# Patient Record
Sex: Female | Born: 1965 | Hispanic: Yes | Marital: Single | Smoking: Never smoker
Health system: Southern US, Community
[De-identification: ages and names within clinical notes are randomized; demographics above are authoritative.]

## PROBLEM LIST (undated history)

## (undated) DIAGNOSIS — E7800 Pure hypercholesterolemia, unspecified: Secondary | ICD-10-CM

## (undated) DIAGNOSIS — I10 Essential (primary) hypertension: Secondary | ICD-10-CM

## (undated) DIAGNOSIS — K219 Gastro-esophageal reflux disease without esophagitis: Secondary | ICD-10-CM

---

## 2019-10-06 ENCOUNTER — Other Ambulatory Visit: Payer: Self-pay

## 2019-10-06 ENCOUNTER — Encounter (HOSPITAL_COMMUNITY): Payer: Self-pay | Admitting: Emergency Medicine

## 2019-10-06 ENCOUNTER — Emergency Department (HOSPITAL_COMMUNITY)
Admission: EM | Admit: 2019-10-06 | Discharge: 2019-10-06 | Disposition: A | Discharge status: Home / Self Care | Payer: Medicaid Other | Attending: Emergency Medicine | Admitting: Emergency Medicine

## 2019-10-06 DIAGNOSIS — R519 Headache, unspecified: Secondary | ICD-10-CM | POA: Diagnosis present

## 2019-10-06 DIAGNOSIS — Z794 Long term (current) use of insulin: Secondary | ICD-10-CM | POA: Insufficient documentation

## 2019-10-06 DIAGNOSIS — I10 Essential (primary) hypertension: Secondary | ICD-10-CM | POA: Insufficient documentation

## 2019-10-06 DIAGNOSIS — E119 Type 2 diabetes mellitus without complications: Secondary | ICD-10-CM | POA: Insufficient documentation

## 2019-10-06 DIAGNOSIS — M5481 Occipital neuralgia: Secondary | ICD-10-CM | POA: Insufficient documentation

## 2019-10-06 DIAGNOSIS — J45909 Unspecified asthma, uncomplicated: Secondary | ICD-10-CM | POA: Diagnosis not present

## 2019-10-06 DIAGNOSIS — L242 Irritant contact dermatitis due to solvents: Secondary | ICD-10-CM | POA: Diagnosis not present

## 2019-10-06 HISTORY — DX: Pure hypercholesterolemia, unspecified: E78.00

## 2019-10-06 HISTORY — DX: Gastro-esophageal reflux disease without esophagitis: K21.9

## 2019-10-06 HISTORY — DX: Essential (primary) hypertension: I10

## 2019-10-06 MED ORDER — HYDROXYZINE HCL 25 MG PO TABS
25.0000 mg | ORAL_TABLET | Freq: Four times a day (QID) | ORAL | 0 refills | Status: AC
Start: 2019-10-06 — End: ?

## 2019-10-06 MED ORDER — PREDNISONE 10 MG PO TABS
20.0000 mg | ORAL_TABLET | Freq: Every day | ORAL | 0 refills | Status: AC
Start: 2019-10-06 — End: 2019-10-11

## 2019-10-06 NOTE — ED Triage Notes (Signed)
Pt reports "bump" to back of head and R side of face painful and itching.

## 2019-10-06 NOTE — Discharge Instructions (Addendum)
You were seen in the ER for scalp irritation  I think you have had a reaction to the hair dye in bleach she used  We will treat you with medicines to help with inflammation and itching  Use prednisone as prescribed, we discussed this medicine can make glucose levels elevated.  Monitor your diet and insulin regimen.  Stop this medicine if your glucose levels are very high.  Use hydroxyzine for itching as needed.  Cleanse your scalp using only water until your symptoms improve  Return to the ER for worsening symptoms, worsening pain, redness, warmth, discharge, fever

## 2019-10-06 NOTE — ED Provider Notes (Signed)
[facility] EMERGENCY DEPARTMENT Provider Note   CSN: [id number] Arrival date & time: 10/06/19  2025     History Chief Complaint  Patient presents with  . bump to back of head  . Facial Pain    Samantha Bauer is a 54 y.o. female with past medical history of diabetes on oral agents and insulin, GERD, hypertension, hyperlipidemia, asthma presents to the ER for sudden onset of diffuse itchiness, swelling and pain on her scalp 2 days ago.  States 3 days ago she dyed her hair and bleached it.  States she is never done this before.  Has no fevers.  No interventions.  No associated symptoms.  HPI     Past Medical History:  Diagnosis Date  . GERD (gastroesophageal reflux disease)   . High cholesterol   . Hypertension     There are no problems to display for this patient.   History reviewed. No pertinent surgical history.   OB History   No obstetric history on file.     No family history on file.  Social History   Tobacco Use  . Smoking status: Never Smoker  . Smokeless tobacco: Never Used  Substance Use Topics  . Alcohol use: Not Currently  . Drug use: Not Currently    Home Medications Prior to Admission medications   Medication Sig Start Date End Date Taking? Authorizing Provider  hydrOXYzine (ATARAX/VISTARIL) 25 MG tablet Take 1 tablet (25 mg total) by mouth every 6 (six) hours. 10/06/19   Liberty Handy, PA-C  predniSONE (DELTASONE) 10 MG tablet Take 2 tablets (20 mg total) by mouth daily for 5 days. 10/06/19 10/11/19  Liberty Handy, PA-C    Allergies    Patient has no allergy information on record.  Review of Systems   Review of Systems  Skin:       Scalp pain swelling redness itching   All other systems reviewed and are negative.   Physical Exam Updated Vital Signs BP (!) 161/91 (BP Location: Left Arm)   Pulse 82   Temp 97.6 F (36.4 C) (Oral)   Resp 20   SpO2 100%   Physical Exam Constitutional:      Appearance:  She is well-developed.  HENT:     Head: Normocephalic.     Nose: Nose normal.  Eyes:     General: Lids are normal.  Cardiovascular:     Rate and Rhythm: Normal rate.  Pulmonary:     Effort: Pulmonary effort is normal. No respiratory distress.  Musculoskeletal:        General: Normal range of motion.     Cervical back: Normal range of motion.  Skin:    Findings: Erythema present.     Comments: Diffuse erythema on the scalp, several pustular lesions mostly in the occipital region.  Patient is itching her scalp during exam.  No focal abscess or fluctuance, abscess.  No tenderness.  Neurological:     Mental Status: She is alert.  Psychiatric:        Behavior: Behavior normal.     ED Results / Procedures / Treatments   Labs (all labs ordered are listed, but only abnormal results are displayed) Labs Reviewed - No data to display  EKG None  Radiology No results found.  Procedures Procedures (including critical care time)  Medications Ordered in ED Medications - No data to display  ED Course  I have reviewed the triage vital signs and the nursing notes.  Pertinent labs &  imaging results that were available during my care of the patient were reviewed by me and considered in my medical decision making (see chart for details).    MDM Rules/Calculators/A&P                          54 year old female presents for diffuse scalp itchiness, pustular lesions, tenderness 2 days after she dyed her hair using bleach.  Exam is consistent with contact dermatitis.  No evidence of focal abscess, cellulitis.  Will discharge with short course of low-dose prednisone, hydroxyzine, Tylenol for pain as needed.  Unable to do topical steroids due to location.  I do not think there is an infectious process, no need for antibiotics here.  Patient has history of diabetes and on insulin, explained to her prednisone could make her glucose levels elevated but to be cautious of her diet, insulin regimen  and stop the medicine if glucose levels are very elevated.  Return precautions discussed.  She is comfortable with this. Final Clinical Impression(s) / ED Diagnoses Final diagnoses:  Irritant contact dermatitis due to solvent  Scalp pain    Rx / DC Orders ED Discharge Orders         Ordered    predniSONE (DELTASONE) 10 MG tablet  Daily     Discontinue  Reprint     10/06/19 1110    hydrOXYzine (ATARAX/VISTARIL) 25 MG tablet  Every 6 hours     Discontinue  Reprint     10/06/19 [place], Jaiyla Granados J, [place] 10/06/19 1110    Gareth Morgan, MD 10/06/19 2209

## 2020-03-06 ENCOUNTER — Emergency Department (HOSPITAL_COMMUNITY)
Admission: EM | Admit: 2020-03-06 | Discharge: 2020-03-06 | Disposition: A | Discharge status: Home / Self Care | Payer: Medicaid Other | Attending: Emergency Medicine | Admitting: Emergency Medicine

## 2020-03-06 ENCOUNTER — Emergency Department (HOSPITAL_COMMUNITY): Payer: Medicaid Other

## 2020-03-06 DIAGNOSIS — I10 Essential (primary) hypertension: Secondary | ICD-10-CM | POA: Insufficient documentation

## 2020-03-06 DIAGNOSIS — K0889 Other specified disorders of teeth and supporting structures: Secondary | ICD-10-CM | POA: Diagnosis not present

## 2020-03-06 DIAGNOSIS — K029 Dental caries, unspecified: Secondary | ICD-10-CM

## 2020-03-06 LAB — CBC
HCT: 39 % (ref 36.0–46.0)
Hemoglobin: 13.3 g/dL (ref 12.0–15.0)
MCH: 30 pg (ref 26.0–34.0)
MCHC: 34.1 g/dL (ref 30.0–36.0)
MCV: 88 fL (ref 80.0–100.0)
Platelets: 222 10*3/uL (ref 150–400)
RBC: 4.43 MIL/uL (ref 3.87–5.11)
RDW: 12.7 % (ref 11.5–15.5)
WBC: 7.5 10*3/uL (ref 4.0–10.5)
nRBC: 0 % (ref 0.0–0.2)

## 2020-03-06 LAB — CBG MONITORING, ED: Glucose-Capillary: 189 mg/dL — ABNORMAL HIGH (ref 70–99)

## 2020-03-06 LAB — BASIC METABOLIC PANEL
Anion gap: 14 (ref 5–15)
BUN: 13 mg/dL (ref 6–20)
CO2: 23 mmol/L (ref 22–32)
Calcium: 9.2 mg/dL (ref 8.9–10.3)
Chloride: 99 mmol/L (ref 98–111)
Creatinine, Ser: 0.64 mg/dL (ref 0.44–1.00)
GFR, Estimated: >60 mL/min (ref >60)
Glucose, Bld: 194 mg/dL — ABNORMAL HIGH (ref 70–99)
Potassium: 3.1 mmol/L — ABNORMAL LOW (ref 3.5–5.1)
Sodium: 136 mmol/L (ref 135–145)

## 2020-03-06 MED ORDER — FENTANYL CITRATE (PF) 100 MCG/2ML IJ SOLN
50.0000 ug | Freq: Once | INTRAMUSCULAR | Status: AC
  Administered 2020-03-06: 50 ug via INTRAVENOUS
  Filled 2020-03-06: qty 2

## 2020-03-06 MED ORDER — CLINDAMYCIN HCL 150 MG PO CAPS: 300.0000 mg | ORAL_CAPSULE | Freq: Three times a day (TID) | ORAL | 0 refills | Status: AC

## 2020-03-06 MED ORDER — MELOXICAM 15 MG PO TABS: 15.0000 mg | ORAL_TABLET | Freq: Every day | ORAL | 0 refills | Status: AC

## 2020-03-06 MED ORDER — ONDANSETRON HCL 4 MG/2ML IJ SOLN
4.0000 mg | Freq: Once | INTRAMUSCULAR | Status: AC
  Administered 2020-03-06: 4 mg via INTRAVENOUS
  Filled 2020-03-06: qty 2

## 2020-03-06 MED ORDER — IOHEXOL 300 MG/ML  SOLN
75.0000 mL | Freq: Once | INTRAMUSCULAR | Status: AC | PRN
  Administered 2020-03-06: 75 mL via INTRAVENOUS

## 2020-03-06 NOTE — Discharge Instructions (Signed)
Your labs and your CT scan showed NO emergent findings.  You do have a dental cavity which may be causing your pain.  I have given you a referral to a dentist here in [place].  Please call to make an appointment.  Otherwise I have prescribed some anti-inflammatory medications and a different type of antibiotic.  You have been seen by your caregiver because of dental pain.  SEEK MEDICAL ATTENTION IF: The exam and treatment you received today has been provided on an emergency basis only. This is not a substitute for complete medical or dental care. If your problem worsens or new symptoms (problems) appear, and you are unable to arrange prompt follow-up care with your dentist, call or return to this location. CALL YOUR DENTIST OR RETURN IMMEDIATELY IF you develop a fever, rash, difficulty breathing or swallowing, neck or facial swelling, or other potentially serious concerns.

## 2020-03-06 NOTE — ED Provider Notes (Signed)
[facility] EMERGENCY DEPARTMENT Provider Note   CSN: [id number] Arrival date & time: 03/06/20  5643     History Chief Complaint  Patient presents with  . Dental Pain    Samantha Bauer is a 54 y.o. female who presents with a cc of dental pain.  Patient is Spanish-speaking and professional translation services are utilized.  She states that she began having pain in the right side of her face and jaw about 2 weeks ago.  She saw her primary care physician in [facility] who put her on a 15-day course of antibiotics.  She states it has not helped at all.  She has significant pain.  She has worsening swelling that extends down into the neck and she states she is now having some difficulty swallowing.  She also has a slight cough which she feels is due to the swelling in that area.  She has pain in the right ear. she denies fevers or chills.  She has a history of hypertension, hyper cholesterol and diabetes.  She takes medications for these daily.  She denies history of tobacco abuse. Review of EMR shows  HPI     Past Medical History:  Diagnosis Date  . GERD (gastroesophageal reflux disease)   . High cholesterol   . Hypertension     There are no problems to display for this patient.   No past surgical history on file.   OB History   No obstetric history on file.     No family history on file.  Social History   Tobacco Use  . Smoking status: Never Smoker  . Smokeless tobacco: Never Used  Substance Use Topics  . Alcohol use: Not Currently  . Drug use: Not Currently    Home Medications Prior to Admission medications   Medication Sig Start Date End Date Taking? Authorizing Provider  hydrOXYzine (ATARAX/VISTARIL) 25 MG tablet Take 1 tablet (25 mg total) by mouth every 6 (six) hours. 10/06/19   Liberty Handy, PA-C    Allergies    Patient has no allergy information on record.  Review of Systems   Review of Systems Ten systems  reviewed and are negative for acute change, except as noted in the HPI.   Physical Exam Updated Vital Signs BP (!) 141/86 (BP Location: Left Arm)   Pulse 76   Temp 98.6 F (37 C) (Oral)   Resp 16   SpO2 98%   Physical Exam Vitals and nursing note reviewed.  Constitutional:      General: She is not in acute distress.    Appearance: She is well-developed. She is not diaphoretic.  HENT:     Head: Normocephalic and atraumatic.     Jaw: Tenderness and pain on movement present. No trismus.      Comments: Tender tonsillar, submandibular and anterior cervical adenopathy on the Right side.    Right Ear: Tympanic membrane normal.     Left Ear: Tympanic membrane normal.     Mouth/Throat:     Mouth: Mucous membranes are moist.   Eyes:     General: No scleral icterus.    Extraocular Movements: Extraocular movements intact.     Conjunctiva/sclera: Conjunctivae normal.     Pupils: Pupils are equal, round, and reactive to light.  Cardiovascular:     Rate and Rhythm: Normal rate and regular rhythm.     Heart sounds: Normal heart sounds. No murmur heard.  No friction rub. No gallop.   Pulmonary:  Effort: Pulmonary effort is normal. No respiratory distress.     Breath sounds: Normal breath sounds.  Abdominal:     General: Bowel sounds are normal. There is no distension.     Palpations: Abdomen is soft. There is no mass.     Tenderness: There is no abdominal tenderness. There is no guarding.  Musculoskeletal:     Cervical back: Normal range of motion.  Skin:    General: Skin is warm and dry.  Neurological:     Mental Status: She is alert and oriented to person, place, and time.  Psychiatric:        Behavior: Behavior normal.     ED Results / Procedures / Treatments   Labs (all labs ordered are listed, but only abnormal results are displayed) Labs Reviewed  BASIC METABOLIC PANEL  CBC  CBG MONITORING, ED    EKG None  Radiology No results found.  Procedures Procedures  (including critical care time)  Medications Ordered in ED Medications - No data to display  ED Course  I have reviewed the triage vital signs and the nursing notes.  Pertinent labs & imaging results that were available during my care of the patient were reviewed by me and considered in my medical decision making (see chart for details).    MDM Rules/Calculators/A&P                          Patient here with complaint of facial and throat swelling.  I ordered and reviewed labs which include CBC which shows no elevated white blood cell count or other abnormality.  CBG was slightly elevated blood glucose, BMP with mild hypokalemia of insignificant value.  I ordered and reviewed CT soft tissue of the neck which shows no acute abnormalities.  She does have a dental carry seen on the CT scan we will treat with clindamycin and pain medications.  Review of EMR shows that she has been referred to ENT and I have given the patient a dental referral.  She appears otherwise appropriate for discharge at this time without evidence of acute emergent cause of her symptoms.  No evidence of Ludwigs angina. Final Clinical Impression(s) / ED Diagnoses Final diagnoses:  None    Rx / DC Orders ED Discharge Orders    None       Arthor Captain, PA-C 03/07/20 1300    Wynetta Fines, MD 03/11/20 [phone]

## 2020-03-06 NOTE — ED Triage Notes (Signed)
To ED via POV -- speaks Spanish as first language-- requesting interpretor-- does c/o dental pain on right side.

## 2021-04-07 ENCOUNTER — Emergency Department (HOSPITAL_COMMUNITY): Payer: Medicaid Other

## 2021-04-07 ENCOUNTER — Encounter (HOSPITAL_COMMUNITY): Payer: Self-pay | Admitting: Emergency Medicine

## 2021-04-07 ENCOUNTER — Other Ambulatory Visit: Payer: Self-pay

## 2021-04-07 ENCOUNTER — Emergency Department (HOSPITAL_COMMUNITY)
Admission: EM | Admit: 2021-04-07 | Discharge: 2021-04-07 | Disposition: A | Discharge status: Home / Self Care | Payer: Medicaid Other | Attending: Emergency Medicine | Admitting: Emergency Medicine

## 2021-04-07 DIAGNOSIS — E119 Type 2 diabetes mellitus without complications: Secondary | ICD-10-CM | POA: Insufficient documentation

## 2021-04-07 DIAGNOSIS — Z794 Long term (current) use of insulin: Secondary | ICD-10-CM | POA: Diagnosis not present

## 2021-04-07 DIAGNOSIS — Z79899 Other long term (current) drug therapy: Secondary | ICD-10-CM | POA: Diagnosis not present

## 2021-04-07 DIAGNOSIS — I10 Essential (primary) hypertension: Secondary | ICD-10-CM | POA: Insufficient documentation

## 2021-04-07 DIAGNOSIS — R102 Pelvic and perineal pain: Secondary | ICD-10-CM | POA: Diagnosis present

## 2021-04-07 DIAGNOSIS — Z7984 Long term (current) use of oral hypoglycemic drugs: Secondary | ICD-10-CM | POA: Diagnosis not present

## 2021-04-07 DIAGNOSIS — K219 Gastro-esophageal reflux disease without esophagitis: Secondary | ICD-10-CM | POA: Insufficient documentation

## 2021-04-07 DIAGNOSIS — B9689 Other specified bacterial agents as the cause of diseases classified elsewhere: Secondary | ICD-10-CM | POA: Diagnosis not present

## 2021-04-07 DIAGNOSIS — Z7982 Long term (current) use of aspirin: Secondary | ICD-10-CM | POA: Diagnosis not present

## 2021-04-07 DIAGNOSIS — N760 Acute vaginitis: Secondary | ICD-10-CM | POA: Diagnosis not present

## 2021-04-07 LAB — CBC WITH DIFFERENTIAL/PLATELET
Abs Immature Granulocytes: 0.07 10*3/uL (ref 0.00–0.07)
Basophils Absolute: 0 10*3/uL (ref 0.0–0.1)
Basophils Relative: 0 %
Eosinophils Absolute: 0.1 10*3/uL (ref 0.0–0.5)
Eosinophils Relative: 2 %
HCT: 34.8 % — ABNORMAL LOW (ref 36.0–46.0)
Hemoglobin: 12 g/dL (ref 12.0–15.0)
Immature Granulocytes: 1 %
Lymphocytes Relative: 40 %
Lymphs Abs: 3 10*3/uL (ref 0.7–4.0)
MCH: 31.5 pg (ref 26.0–34.0)
MCHC: 34.5 g/dL (ref 30.0–36.0)
MCV: 91.3 fL (ref 80.0–100.0)
Monocytes Absolute: 0.5 10*3/uL (ref 0.1–1.0)
Monocytes Relative: 6 %
Neutro Abs: 3.8 10*3/uL (ref 1.7–7.7)
Neutrophils Relative %: 51 %
Platelets: 182 10*3/uL (ref 150–400)
RBC: 3.81 MIL/uL — ABNORMAL LOW (ref 3.87–5.11)
RDW: 12.1 % (ref 11.5–15.5)
WBC: 7.4 10*3/uL (ref 4.0–10.5)
nRBC: 0 % (ref 0.0–0.2)

## 2021-04-07 LAB — URINALYSIS, ROUTINE W REFLEX MICROSCOPIC
Bilirubin Urine: NEGATIVE
Glucose, UA: >=500 mg/dL — AB
Hgb urine dipstick: NEGATIVE
Ketones, ur: 5 mg/dL — AB
Nitrite: NEGATIVE
Protein, ur: 30 mg/dL — AB
Specific Gravity, Urine: 1.024 (ref 1.005–1.030)
pH: 5 (ref 5.0–8.0)

## 2021-04-07 LAB — BASIC METABOLIC PANEL
Anion gap: 7 (ref 5–15)
BUN: 16 mg/dL (ref 6–20)
CO2: 26 mmol/L (ref 22–32)
Calcium: 8.5 mg/dL — ABNORMAL LOW (ref 8.9–10.3)
Chloride: 103 mmol/L (ref 98–111)
Creatinine, Ser: 0.71 mg/dL (ref 0.44–1.00)
GFR, Estimated: >60 mL/min (ref >60)
Glucose, Bld: 290 mg/dL — ABNORMAL HIGH (ref 70–99)
Potassium: 3.9 mmol/L (ref 3.5–5.1)
Sodium: 136 mmol/L (ref 135–145)

## 2021-04-07 MED ORDER — SODIUM CHLORIDE 0.9 % IV BOLUS
1000.0000 mL | Freq: Once | INTRAVENOUS | Status: AC
  Administered 2021-04-07: 13:00:00 1000 mL via INTRAVENOUS

## 2021-04-07 MED ORDER — HYDROCODONE-ACETAMINOPHEN 5-325 MG PO TABS
1.0000 | ORAL_TABLET | Freq: Once | ORAL | Status: AC
  Administered 2021-04-07: 13:00:00 1 via ORAL
  Filled 2021-04-07: qty 1

## 2021-04-07 MED ORDER — NAPROXEN 375 MG PO TABS
375.0000 mg | ORAL_TABLET | Freq: Two times a day (BID) | ORAL | 0 refills | Status: AC
Start: 2021-04-07 — End: ?

## 2021-04-07 MED ORDER — FLUCONAZOLE 200 MG PO TABS
200.0000 mg | ORAL_TABLET | ORAL | 0 refills | Status: AC | PRN
Start: 2021-04-07 — End: 2021-04-10

## 2021-04-07 MED ORDER — ACETAMINOPHEN ER 650 MG PO TBCR
650.0000 mg | EXTENDED_RELEASE_TABLET | Freq: Three times a day (TID) | ORAL | 0 refills | Status: AC
Start: 2021-04-07 — End: ?

## 2021-04-07 NOTE — Discharge Instructions (Addendum)
Please start taking the antifungal medication prescribed.  Also take ibuprofen and Tylenol for pain control.  Expect a call from [facility] afternoon.  If you do not hear from them please call the number provided.

## 2021-04-07 NOTE — ED Provider Notes (Signed)
[facility] EMERGENCY DEPARTMENT Provider Note   CSN: [id number] Arrival date & time: 04/07/21  4315     History No chief complaint on file.   Samantha Bauer is a 55 y.o. female.  HPI    55 year old female comes in with chief complaint of vaginal pain.  Translation service was utilized for this visit.  Chaperone was also utilized for physical exam.  Patient has history of diabetes, hypertension and hyperlipidemia.  She reports that she has had a rash over the vaginal area for the last 5 days, the last 3 days have been painful.  She has some burning pain that is constant, worse when she urinates.  She is also noted some discharge.  No history of similar symptoms.  Denies any trauma.  Review of systems negative for any fevers or chills.  Past Medical History:  Diagnosis Date   GERD (gastroesophageal reflux disease)    High cholesterol    Hypertension     There are no problems to display for this patient.   History reviewed. No pertinent surgical history.   OB History   No obstetric history on file.     No family history on file.  Social History   Tobacco Use   Smoking status: Never   Smokeless tobacco: Never  Substance Use Topics   Alcohol use: Not Currently   Drug use: Not Currently    Home Medications Prior to Admission medications   Medication Sig Start Date End Date Taking? Authorizing Provider  acetaminophen (TYLENOL 8 HOUR) 650 MG CR tablet Take 1 tablet (650 mg total) by mouth every 8 (eight) hours. 04/07/21  Yes Derwood Kaplan, MD  fluconazole (DIFLUCAN) 200 MG tablet Take 1 tablet (200 mg total) by mouth every 3 (three) days as needed for up to 3 days. 04/07/21 04/10/21 Yes Derwood Kaplan, MD  naproxen (NAPROSYN) 375 MG tablet Take 1 tablet (375 mg total) by mouth 2 (two) times daily. 04/07/21  Yes Derwood Kaplan, MD  aspirin 325 MG EC tablet Take 325 mg by mouth daily.    [provider]  clindamycin (CLEOCIN) 150  MG capsule Take 2 capsules (300 mg total) by mouth 3 (three) times daily. May dispense as 150mg  capsules 03/06/20   Harris, Abigail, PA-C  glipiZIDE (GLUCOTROL) 10 MG tablet Take 10 mg by mouth in the morning and at bedtime.    [provider]  hydrochlorothiazide (HYDRODIURIL) 25 MG tablet Take 25 mg by mouth daily.    [provider]  HYDROcodone-acetaminophen (NORCO) 10-325 MG tablet Take 1 tablet by mouth every 6 (six) hours as needed for moderate pain.    [provider]  hydrOXYzine (ATARAX/VISTARIL) 25 MG tablet Take 1 tablet (25 mg total) by mouth every 6 (six) hours. Patient not taking: Reported on 03/06/2020 10/06/19   10/08/19, PA-C  insulin glargine (LANTUS) 100 UNIT/ML injection Inject 100 Units into the skin daily.    [provider]  insulin lispro (HUMALOG) 100 UNIT/ML injection Inject 100 Units into the skin once.    [provider]  lisinopril (ZESTRIL) 40 MG tablet Take 40 mg by mouth daily.    [provider]  meloxicam (MOBIC) 15 MG tablet Take 1 tablet (15 mg total) by mouth daily. Take 1 daily with food. 03/06/20   03/08/20, PA-C  metFORMIN HCl ER 500 MG/5ML SRER Take 2 tablets by mouth in the morning and at bedtime.    [provider]    Allergies  Patient has no allergy information on record.  Review of Systems   Review of Systems  Constitutional:  Positive for activity change.  Genitourinary:  Positive for vaginal pain.   Physical Exam Updated Vital Signs BP 132/79    Pulse 77    Temp 98.2 F (36.8 C) (Oral)    Resp 18    SpO2 99%   Physical Exam Vitals and nursing note reviewed.  Constitutional:      Appearance: She is well-developed.  HENT:     Head: Atraumatic.  Cardiovascular:     Rate and Rhythm: Normal rate.  Pulmonary:     Effort: Pulmonary effort is normal.  Genitourinary:    Comments: Edematous and erythematous vulva-vaginal area Skin:    General: Skin is warm and  dry.  Neurological:     Mental Status: She is alert and oriented to person, place, and time.    ED Results / Procedures / Treatments   Labs (all labs ordered are listed, but only abnormal results are displayed) Labs Reviewed  URINALYSIS, ROUTINE W REFLEX MICROSCOPIC - Abnormal; Notable for the following components:      Result Value   APPearance HAZY (*)    Glucose, UA >=500 (*)    Ketones, ur 5 (*)    Protein, ur 30 (*)    Leukocytes,Ua MODERATE (*)    Bacteria, UA RARE (*)    All other components within normal limits  CBC WITH DIFFERENTIAL/PLATELET - Abnormal; Notable for the following components:   RBC 3.81 (*)    HCT 34.8 (*)    All other components within normal limits  BASIC METABOLIC PANEL - Abnormal; Notable for the following components:   Glucose, Bld 290 (*)    Calcium 8.5 (*)    All other components within normal limits  WET PREP, GENITAL    EKG None  Radiology CT PELVIS WO CONTRAST  Result Date: 04/07/2021 CLINICAL DATA:  Soft tissue infection EXAM: CT PELVIS WITHOUT CONTRAST TECHNIQUE: Multidetector CT imaging of the pelvis was performed following the standard protocol without intravenous contrast. COMPARISON:  None. FINDINGS: Urinary Tract:  Urinary bladder appears normal. Bowel: No evidence of bowel obstruction. Moderate amount of retained fecal material throughout the colon. No bowel wall edema visualized. Appendix partially visualized and appears normal. Vascular/Lymphatic: No bulky lymphadenopathy identified. No significant atherosclerotic disease noted. Reproductive:  Uterus and adnexa appear grossly normal. Other: No defined fluid collection identified in the soft tissues. No subcutaneous emphysema. Mild subcutaneous fat stranding in the anterior pelvis region of the pubic symphysis. Musculoskeletal: No suspicious bony lesions identified. Chronic osteitis pubis. Sclerotic degenerative changes of the sacroiliac joints. IMPRESSION: 1. No fluid collection or  subcutaneous emphysema identified in the soft tissues. 2. Nonspecific mild subcutaneous fat stranding in the anterior pelvis. 3. Other chronic findings as described. Electronically Signed   By: Jannifer Hick M.D.   On: 04/07/2021 13:44    Procedures Procedures   Medications Ordered in ED Medications  sodium chloride 0.9 % bolus 1,000 mL (0 mLs Intravenous Stopped 04/07/21 1351)  HYDROcodone-acetaminophen (NORCO/VICODIN) 5-325 MG per tablet 1 tablet (1 tablet Oral Given 04/07/21 1230)    ED Course  I have reviewed the triage vital signs and the nursing notes.  Pertinent labs & imaging results that were available during my care of the patient were reviewed by me and considered in my medical decision making (see chart for details).    MDM Rules/Calculators/A&P  55 year old female comes in with chief complaint of painful vulvovaginal region.  Rash started 5 days ago, pain started 3 days ago.  There is significant edema.  Wet prep was ordered with concerns for Candida as the underlying cause.  Unfortunately the wet prep was lost.  CT scan does not show any evidence of internal abscess or cellulitis even.  Discussed case with Dr. Ladean Raya, gynecologist.  She will graciously see the patient in her clinic soon.  She does recommend treating this with Diflucan -which patient is in agreement about.  Strict ER return precautions have been discussed, and patient is agreeing with the plan and is comfortable with the workup done and the recommendations from the ER.     Final Clinical Impression(s) / ED Diagnoses Final diagnoses:  Vulvovaginitis    Rx / DC Orders ED Discharge Orders          Ordered    fluconazole (DIFLUCAN) 200 MG tablet  Every 72 hours PRN       Note to Pharmacy: PRN PERSISTENT INFLAMMATION IN THE VULVAR REGION   04/07/21 1523    acetaminophen (TYLENOL 8 HOUR) 650 MG CR tablet  Every 8 hours        04/07/21 1523    naproxen (NAPROSYN) 375  MG tablet  2 times daily        04/07/21 1523             Derwood Kaplan, MD 04/07/21 1548

## 2021-04-07 NOTE — ED Triage Notes (Signed)
Pt endorses vaginal rash 5 days ago. Endorses constant burning, pain and red.

## 2021-04-19 ENCOUNTER — Telehealth: Payer: Self-pay | Admitting: Radiology

## 2021-04-19 NOTE — Telephone Encounter (Signed)
Tried calling twice to inform patient of appointment scheduled on 04/27/21 @ 3:35 with Dr Crissie Reese @ [facility]. Unable to leave voicemail. Will try again on different date. If unable to contact patient will cancel appointment.

## 2021-04-21 ENCOUNTER — Telehealth: Payer: Self-pay | Admitting: Radiology

## 2021-04-21 NOTE — Telephone Encounter (Signed)
Tired calling patient via interpreter services to inform patient of appointment scheduled for 04/27/21 @ 3:35 with Dr Crissie Reese. Number does not work- no service. Cancelling appointment.

## 2021-04-27 ENCOUNTER — Ambulatory Visit: Payer: Medicaid Other | Admitting: Family Medicine

## 2022-01-28 ENCOUNTER — Emergency Department (HOSPITAL_COMMUNITY): Payer: Medicaid Other

## 2022-01-28 ENCOUNTER — Emergency Department (HOSPITAL_COMMUNITY)
Admission: EM | Admit: 2022-01-28 | Discharge: 2022-01-28 | Discharge status: Against Medical Advice | Payer: Medicaid Other | Attending: Student | Admitting: Student

## 2022-01-28 ENCOUNTER — Other Ambulatory Visit: Payer: Self-pay

## 2022-01-28 DIAGNOSIS — R197 Diarrhea, unspecified: Secondary | ICD-10-CM | POA: Insufficient documentation

## 2022-01-28 DIAGNOSIS — Z5321 Procedure and treatment not carried out due to patient leaving prior to being seen by health care provider: Secondary | ICD-10-CM | POA: Insufficient documentation

## 2022-01-28 DIAGNOSIS — R1013 Epigastric pain: Secondary | ICD-10-CM | POA: Diagnosis present

## 2022-01-28 LAB — CBC WITH DIFFERENTIAL/PLATELET
Abs Immature Granulocytes: 0.06 10*3/uL (ref 0.00–0.07)
Basophils Absolute: 0 10*3/uL (ref 0.0–0.1)
Basophils Relative: 0 %
Eosinophils Absolute: 0.2 10*3/uL (ref 0.0–0.5)
Eosinophils Relative: 2 %
HCT: 43.3 % (ref 36.0–46.0)
Hemoglobin: 15.3 g/dL — ABNORMAL HIGH (ref 12.0–15.0)
Immature Granulocytes: 1 %
Lymphocytes Relative: 39 %
Lymphs Abs: 3.4 10*3/uL (ref 0.7–4.0)
MCH: 31.6 pg (ref 26.0–34.0)
MCHC: 35.3 g/dL (ref 30.0–36.0)
MCV: 89.5 fL (ref 80.0–100.0)
Monocytes Absolute: 0.6 10*3/uL (ref 0.1–1.0)
Monocytes Relative: 6 %
Neutro Abs: 4.4 10*3/uL (ref 1.7–7.7)
Neutrophils Relative %: 52 %
Platelets: 231 10*3/uL (ref 150–400)
RBC: 4.84 MIL/uL (ref 3.87–5.11)
RDW: 12.2 % (ref 11.5–15.5)
WBC: 8.6 10*3/uL (ref 4.0–10.5)
nRBC: 0 % (ref 0.0–0.2)

## 2022-01-28 LAB — URINALYSIS, ROUTINE W REFLEX MICROSCOPIC
Bilirubin Urine: NEGATIVE
Glucose, UA: 150 mg/dL — AB
Hgb urine dipstick: NEGATIVE
Ketones, ur: NEGATIVE mg/dL
Nitrite: NEGATIVE
Protein, ur: >=300 mg/dL — AB
Specific Gravity, Urine: 1.017 (ref 1.005–1.030)
pH: 5 (ref 5.0–8.0)

## 2022-01-28 LAB — COMPREHENSIVE METABOLIC PANEL
ALT: 26 U/L (ref 0–44)
AST: 23 U/L (ref 15–41)
Albumin: 3.7 g/dL (ref 3.5–5.0)
Alkaline Phosphatase: 109 U/L (ref 38–126)
Anion gap: 10 (ref 5–15)
BUN: 14 mg/dL (ref 6–20)
CO2: 29 mmol/L (ref 22–32)
Calcium: 9.2 mg/dL (ref 8.9–10.3)
Chloride: 97 mmol/L — ABNORMAL LOW (ref 98–111)
Creatinine, Ser: 0.8 mg/dL (ref 0.44–1.00)
GFR, Estimated: >60 mL/min (ref >60)
Glucose, Bld: 248 mg/dL — ABNORMAL HIGH (ref 70–99)
Potassium: 4.2 mmol/L (ref 3.5–5.1)
Sodium: 136 mmol/L (ref 135–145)
Total Bilirubin: 0.7 mg/dL (ref 0.3–1.2)
Total Protein: 7.1 g/dL (ref 6.5–8.1)

## 2022-01-28 LAB — LIPASE, BLOOD: Lipase: 26 U/L (ref 11–51)

## 2022-01-28 MED ORDER — IOHEXOL 350 MG/ML SOLN
75.0000 mL | Freq: Once | INTRAVENOUS | Status: AC | PRN
  Administered 2022-01-28: 75 mL via INTRAVENOUS

## 2022-01-28 NOTE — ED Triage Notes (Signed)
Pt. Stated, Donnald Garre had stomach pain with diarrhea for 2 days.

## 2022-01-28 NOTE — ED Notes (Signed)
Pt wanted IV taken out and she wanted to leave. Pt seen leaving the ED.

## 2022-01-28 NOTE — ED Provider Triage Note (Signed)
Emergency Medicine Provider Triage Evaluation Note  Fredrick Dray , a 56 y.o. female  was evaluated in triage.  Pt complains of epigastric abdominal pain which began 2 days ago.  She denies nausea and vomiting but endorses diarrhea.  Denies shortness of breath, chest pain, dysuria.  Review of Systems  Positive: As above Negative: As above  Physical Exam  BP (!) 173/101 (BP Location: Right Arm)   Pulse 80   Temp 98.2 F (36.8 C) (Oral)   Resp 16   SpO2 97%  Gen:   Awake, no distress   Resp:  Normal effort  MSK:   Moves extremities without difficulty  Other:  Epigastric, RUQ, RLQ tenderness to palpation  Medical Decision Making  Medically screening exam initiated at 9:22 AM.  Appropriate orders placed.  Shetara Launer was informed that the remainder of the evaluation will be completed by another provider, this initial triage assessment does not replace that evaluation, and the importance of remaining in the ED until their evaluation is complete.     Dorothyann Peng, [place] 01/28/22 [phone]

## 2022-02-12 IMAGING — CT CT NECK W/ CM
4 of 5 series · 14 of 33 positions shown, 16 images · IV contrast (omnipaque)
Comparison: None.

CLINICAL DATA: 54-year-old female with right side dental pain,
right neck pain, mass.

EXAM:
CT NECK WITH CONTRAST
TECHNIQUE: Multidetector CT imaging of the neck was performed using the
standard protocol following the bolus administration of intravenous
contrast.
CONTRAST:  75mL OMNIPAQUE IOHEXOL 300 MG/ML  SOLN

[Series 3: neck 2.0 st · axial · 0.37mm/px · z∈[-24,+128]mm · 4 of 128 slices shown, 5 images]
[im 26/128  soft-tissue]
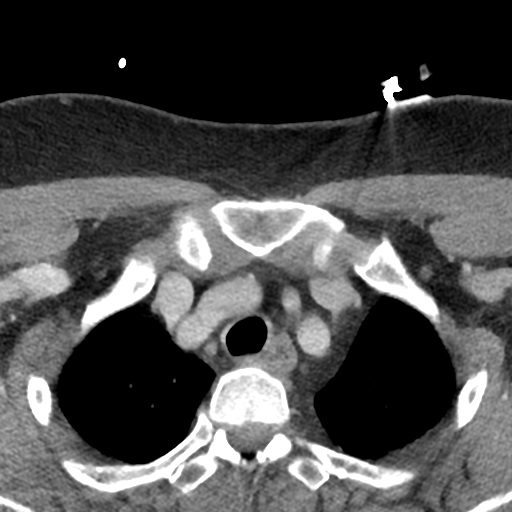
[im 26/128  bone]
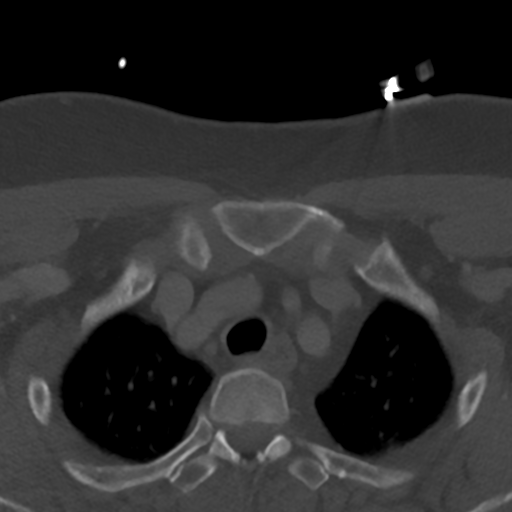
[im 51/128  bone]
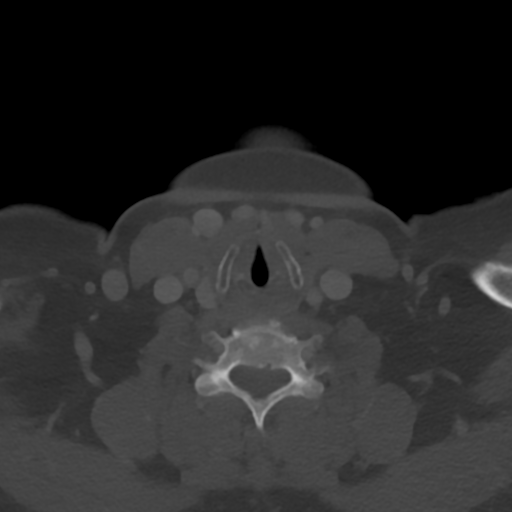
[im 77/128  bone]
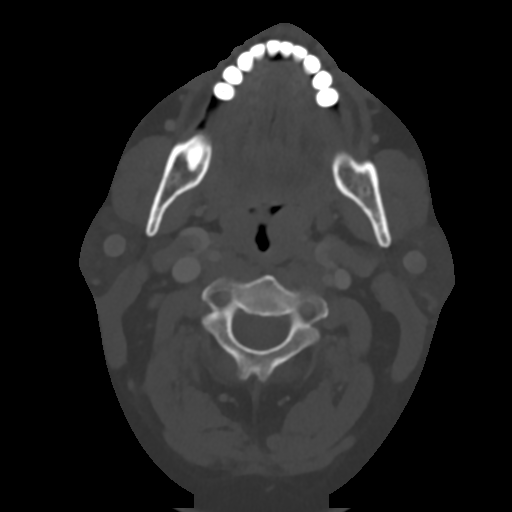
[im 102/128  bone]
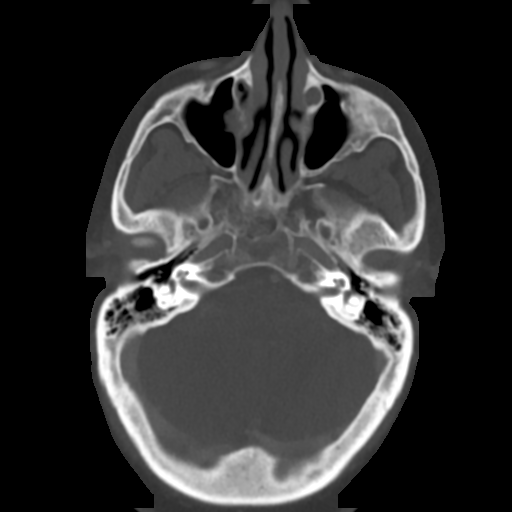

[Series 5: sagittal · sagittal · 0.41mm/px · 5 of 101 slices shown, 6 images]
[im 34/101  bone]
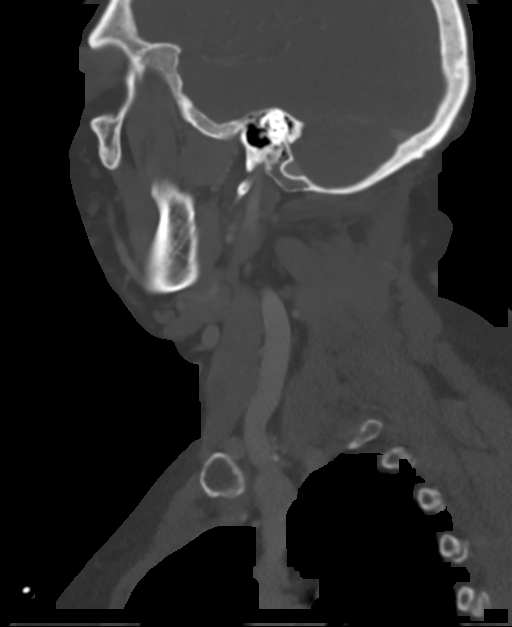
[im 42/101  bone]
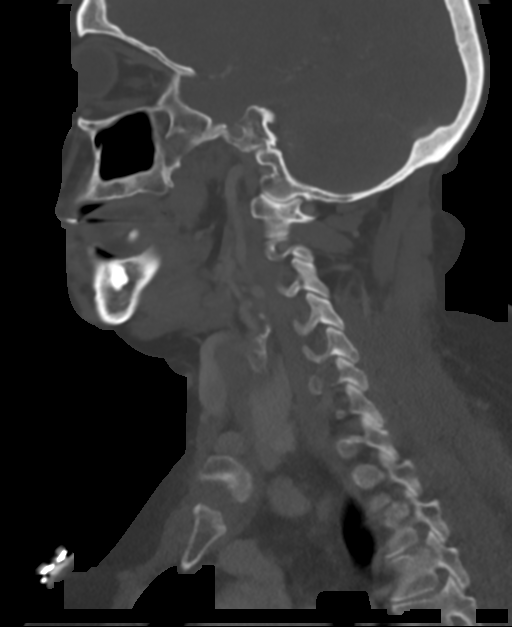
[im 51/101  soft-tissue]
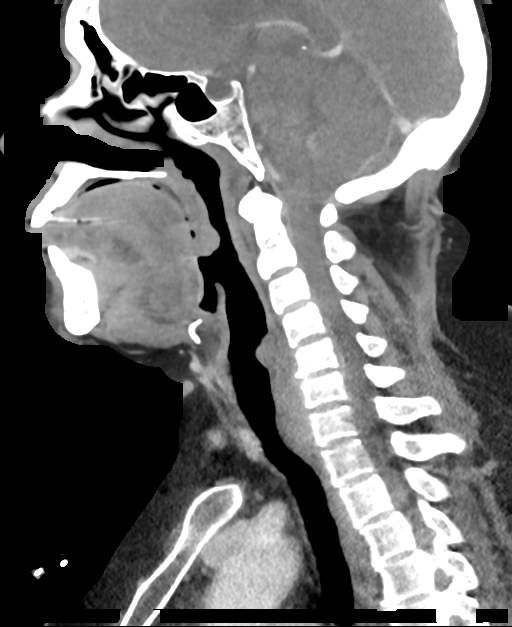
[im 51/101  bone]
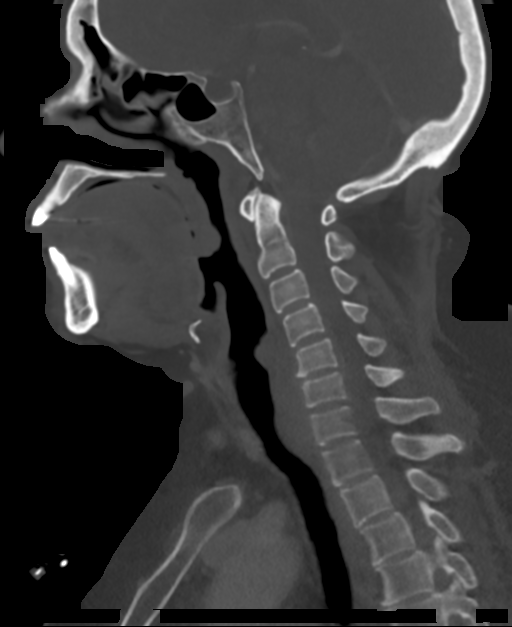
[im 59/101  bone]
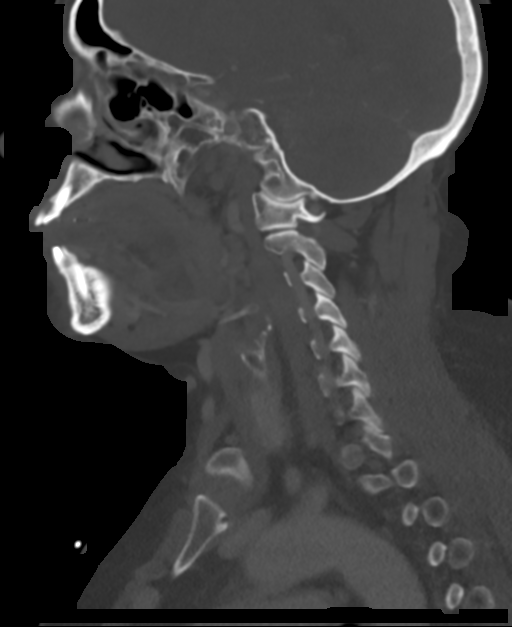
[im 67/101  bone]
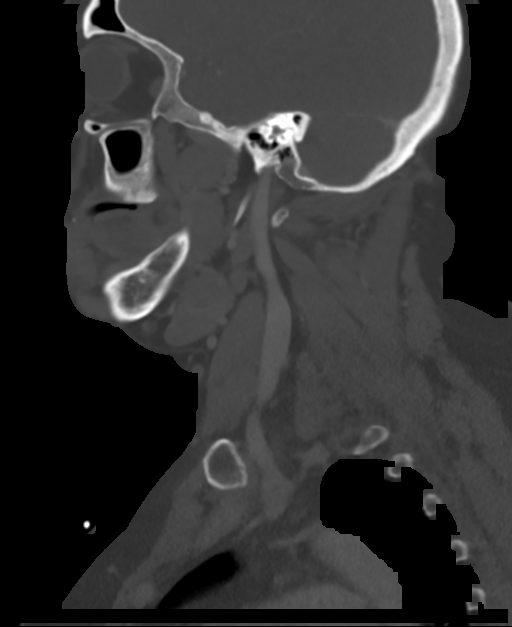

[Series 6: coronal · coronal · 0.32mm/px · 3 of 101 slices shown]
[im 21/101  bone]
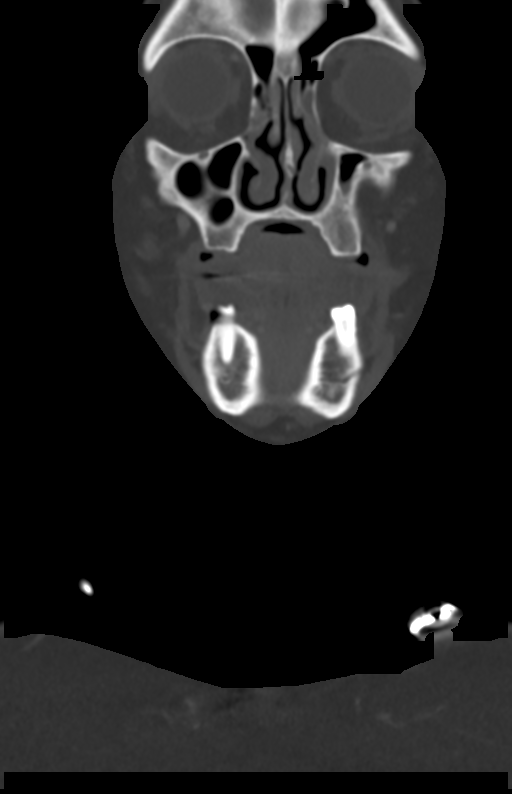
[im 41/101  bone]
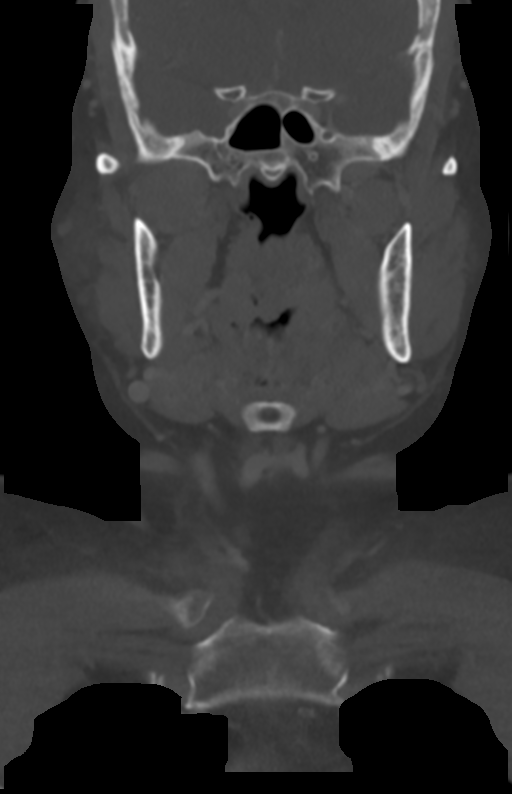
[im 61/101  bone]
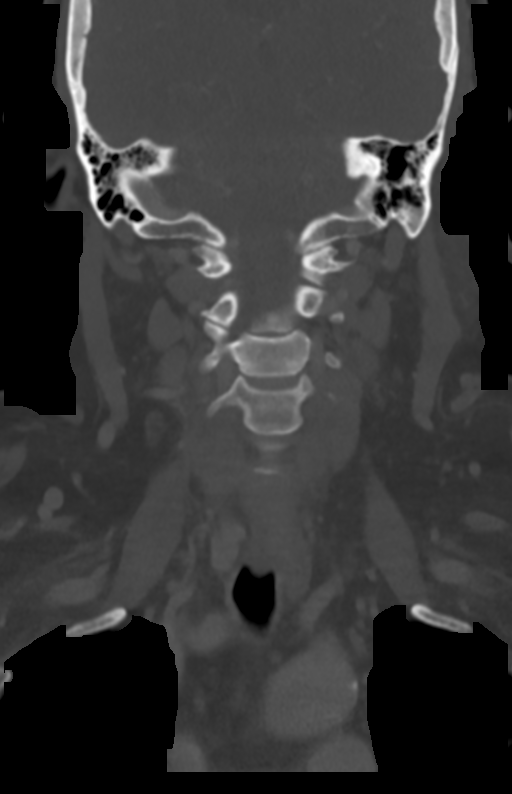

[Series 7: orthogonal · axial · 0.39mm/px · z∈[-22,+28]mm · 2 of 127 slices shown]
[im 26/127  bone]
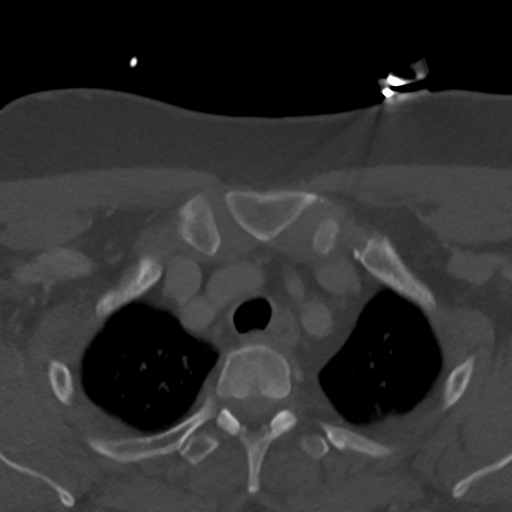
[im 51/127  bone]
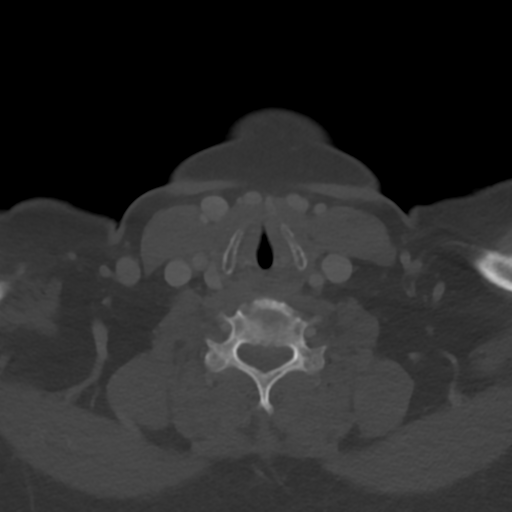

[14 of 33 positions shown; findings below may reference images not displayed]

FINDINGS: Pharynx and larynx: Larynx and pharynx soft tissue contours are
within normal limits. Symmetric appearing enlargement of the
palatine tonsils. Negative parapharyngeal spaces. Negative
retropharyngeal space; partially retropharyngeal course of the right
ICA.

Salivary glands: Negative sublingual space. Both submandibular
glands and spaces remain within normal limits. Masticator spaces
appear symmetric and within normal limits. Bilateral parotid glands
are within normal limits.

Thyroid: Negative.

Lymph nodes: Negative. No cervical lymphadenopathy. Small right
level 1 and level 2 lymph nodes with normal fatty hila. No focal
soft tissue inflammation identified.

Vascular: Major vascular structures in the neck and at the skull
base are patent. No atherosclerosis identified.

Limited intracranial: Partially empty sella, otherwise negative.

Visualized orbits: Negative.

Mastoids and visualized paranasal sinuses: Scattered bilateral
anterior ethmoid and right posterior ethmoid sinus mucosal
thickening. Left greater than right maxillary sinus mucoperiosteal
thickening. Trace bubbly opacity in the right maxillary sinus.

Tympanic cavities and mastoids are clear.

Skeleton: Most of the bilateral posterior dentition has been
previously extracted. Residual right mandible molar is mildly
carious, but with no periapical lucency. No acute dental process
identified. Mandible intact. No acute osseous abnormality
identified.

Upper chest: Mild Calcified aortic atherosclerosis. Visible superior
mediastinal lymph nodes are within normal limits. Negative lung
apices.
IMPRESSION: 1. Negative CT appearance of the neck. No inflammatory process
identified. Carious posterior right mandible molar with no acute
dental process identified.

2. Mild acute on chronic paranasal sinus disease.

## 2023-03-16 IMAGING — CT CT PELVIS W/O CM
2 of 3 series · 16 of 46 positions shown, 18 images · non-contrast
Comparison: None.

CLINICAL DATA: Soft tissue infection

EXAM:
CT PELVIS WITHOUT CONTRAST
TECHNIQUE: Multidetector CT imaging of the pelvis was performed following the
standard protocol without intravenous contrast.

[Series 3: pelvis w/o 5.0 · axial · non-contrast · 0.85mm/px · z∈[+713,+918]mm · 13 of 49 slices shown, 15 images]
[im 4/49  soft-tissue]
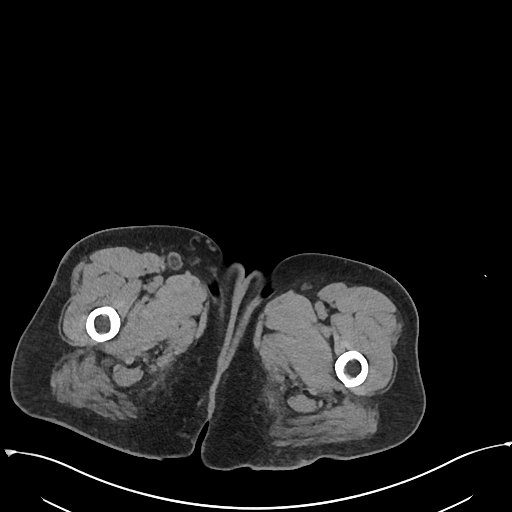
[im 4/49  bone]
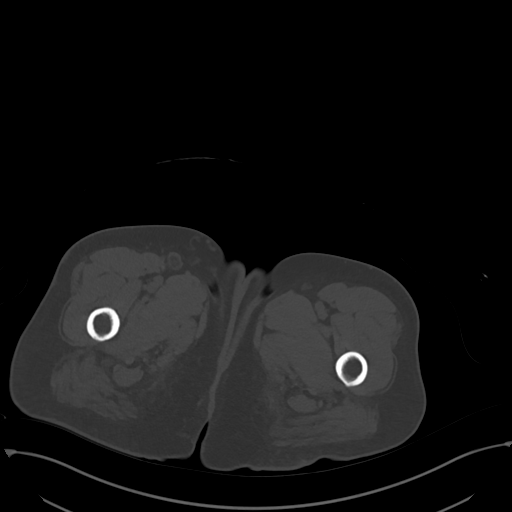
[im 7/49  soft-tissue]
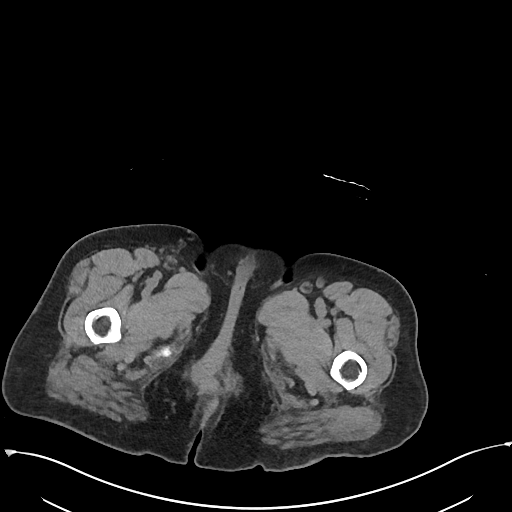
[im 10/49  soft-tissue]
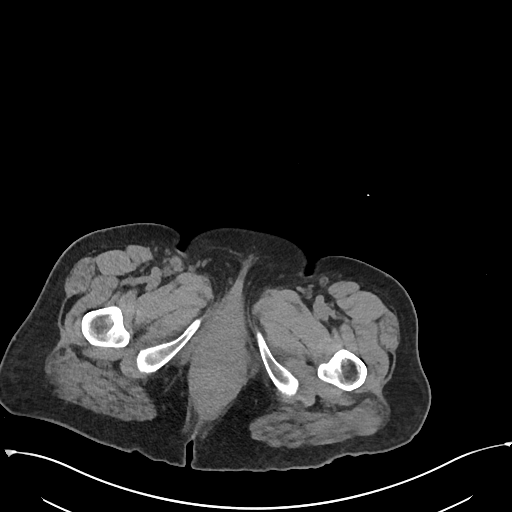
[im 14/49  soft-tissue]
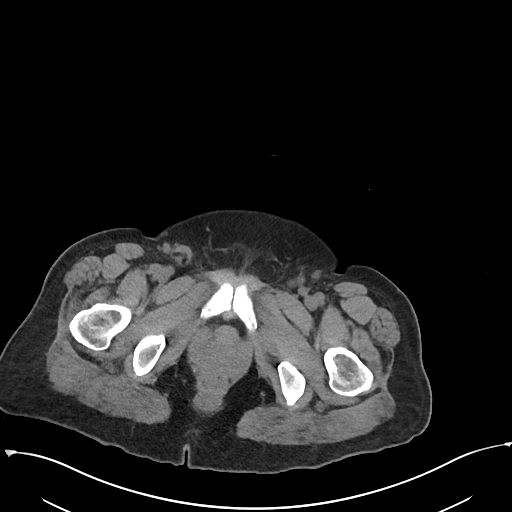
[im 18/49  soft-tissue]
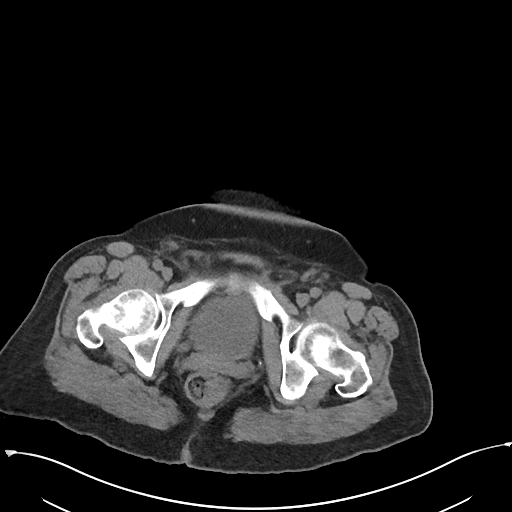
[im 21/49  soft-tissue]
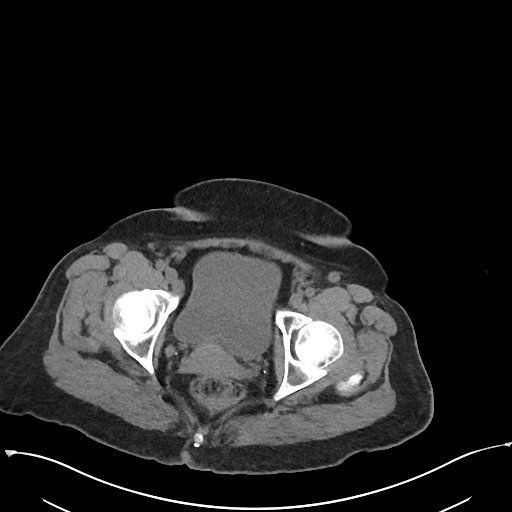
[im 25/49  soft-tissue]
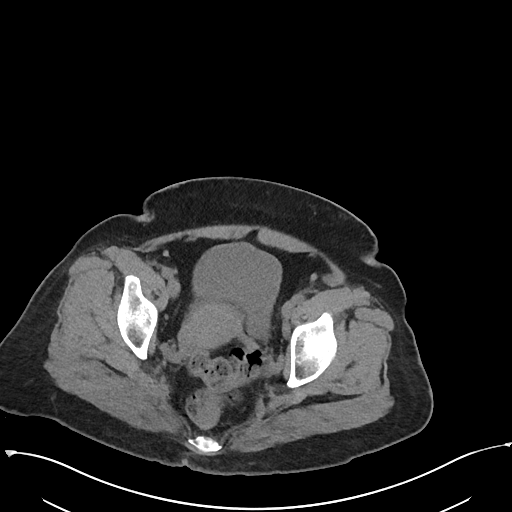
[im 28/49  soft-tissue]
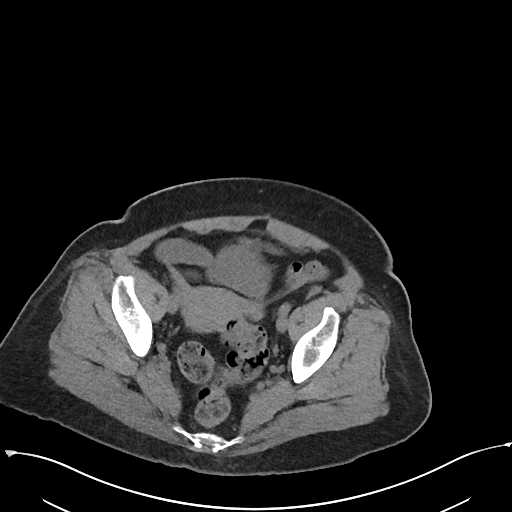
[im 31/49  soft-tissue]
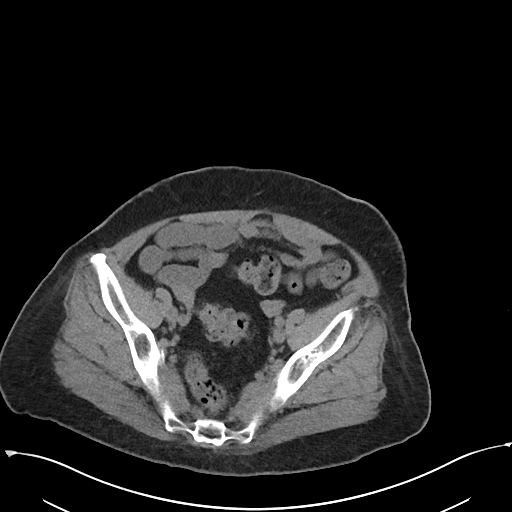
[im 31/49  bone]
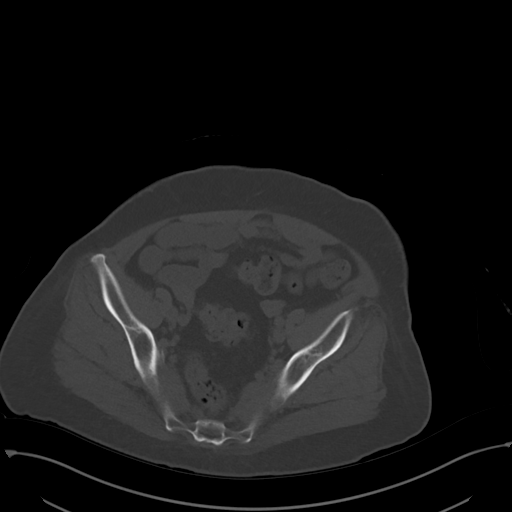
[im 35/49  soft-tissue]
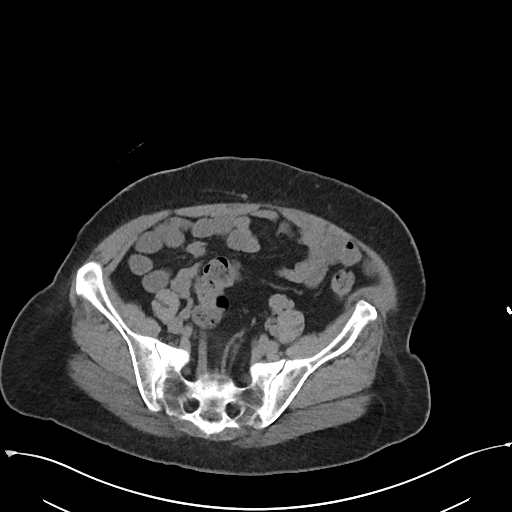
[im 39/49  soft-tissue]
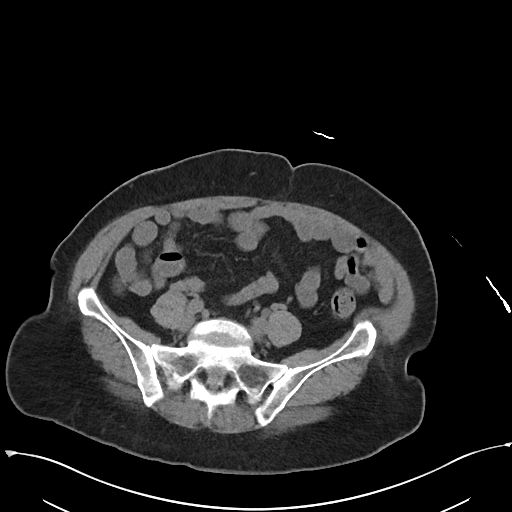
[im 42/49  soft-tissue]
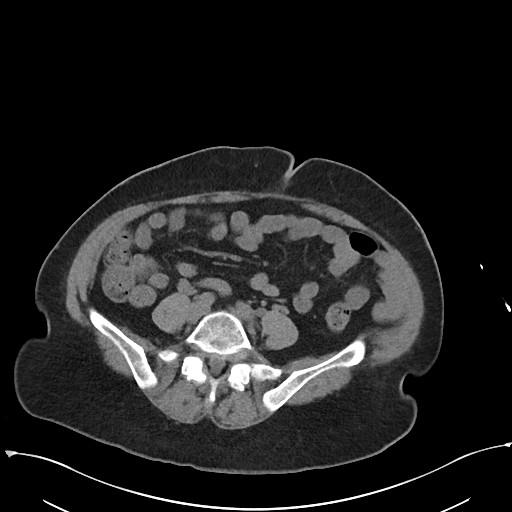
[im 45/49  soft-tissue]
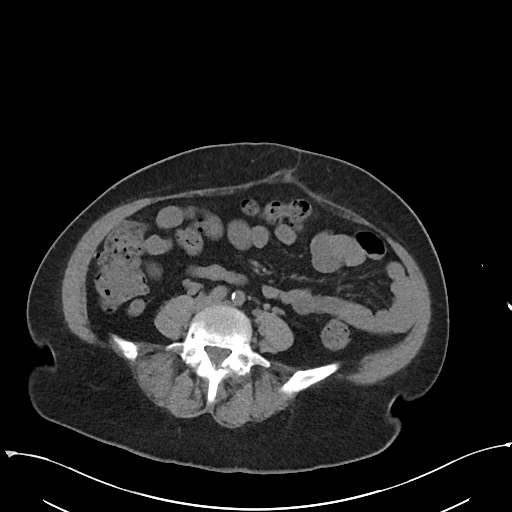

[Series 5: pelvis w/o 2.0 cor · coronal · non-contrast · 0.47mm/px · 3 of 151 slices shown]
[im 51/151  soft-tissue]
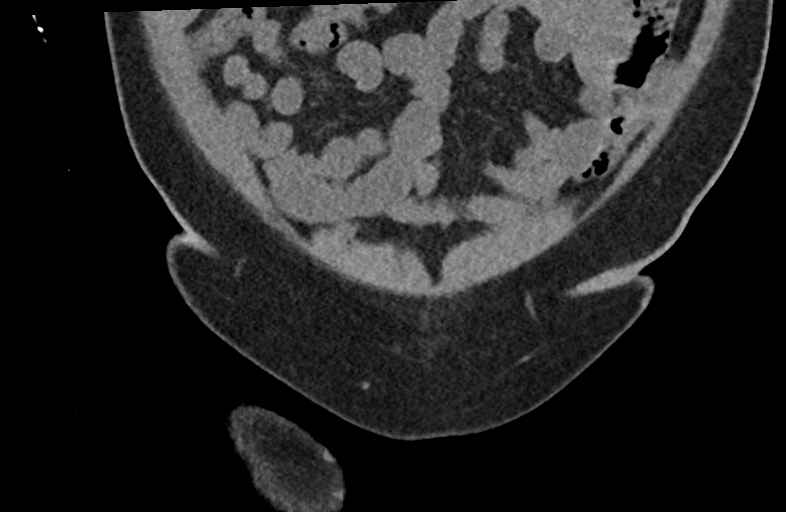
[im 67/151  soft-tissue]
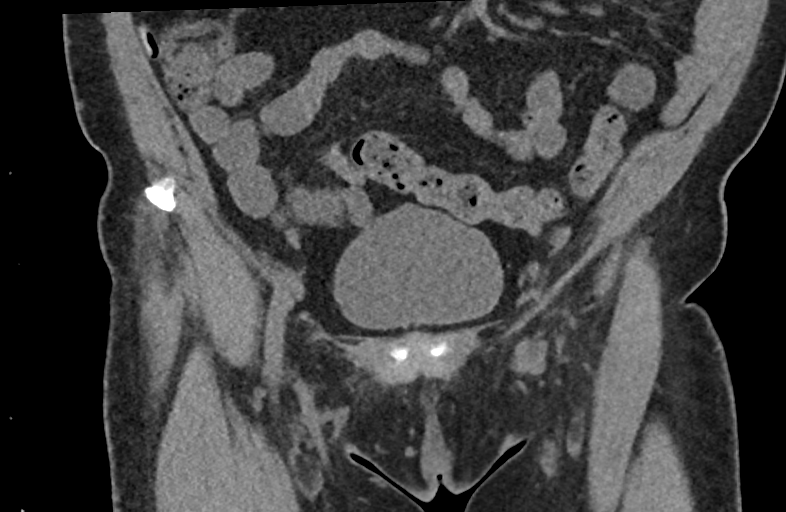
[im 84/151  soft-tissue]
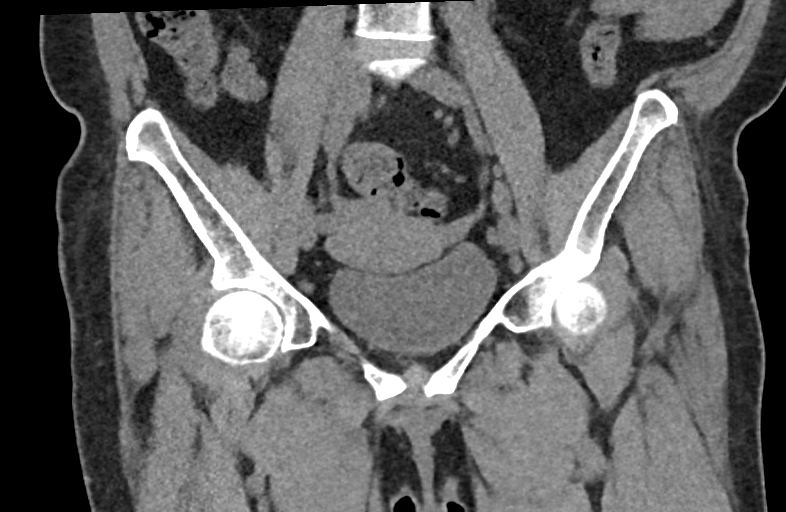

[16 of 46 positions shown; findings below may reference images not displayed]

FINDINGS: Urinary Tract:  Urinary bladder appears normal.

Bowel: No evidence of bowel obstruction. Moderate amount of retained
fecal material throughout the colon. No bowel wall edema visualized.
Appendix partially visualized and appears normal.

Vascular/Lymphatic: No bulky lymphadenopathy identified. No
significant atherosclerotic disease noted.

Reproductive:  Uterus and adnexa appear grossly normal.

Other: No defined fluid collection identified in the soft tissues.
No subcutaneous emphysema. Mild subcutaneous fat stranding in the
anterior pelvis region of the pubic symphysis.

Musculoskeletal: No suspicious bony lesions identified. Chronic
osteitis pubis. Sclerotic degenerative changes of the sacroiliac
joints.
IMPRESSION: 1. No fluid collection or subcutaneous emphysema identified in the
soft tissues.
2. Nonspecific mild subcutaneous fat stranding in the anterior
pelvis.
3. Other chronic findings as described.
